# Patient Record
Sex: Male | Born: 1960 | Hispanic: Yes | Marital: Married
Health system: Southern US, Community
[De-identification: ages and names within clinical notes are randomized; demographics above are authoritative.]

## PROBLEM LIST (undated history)

## (undated) HISTORY — PX: VASCULAR SURGERY: SHX849

---

## 2011-10-04 ENCOUNTER — Inpatient Hospital Stay: Payer: Self-pay | Admitting: Surgery

## 2011-10-04 LAB — COMPREHENSIVE METABOLIC PANEL
Albumin: 4.4 g/dL (ref 3.4–5.0)
Alkaline Phosphatase: 79 U/L (ref 50–136)
Anion Gap: 5 — ABNORMAL LOW (ref 7–16)
Calcium, Total: 9.6 mg/dL (ref 8.5–10.1)
Chloride: 101 mmol/L (ref 98–107)
EGFR (African American): >60
EGFR (Non-African Amer.): >60
Glucose: 133 mg/dL — ABNORMAL HIGH (ref 65–99)
Potassium: 3.9 mmol/L (ref 3.5–5.1)
SGOT(AST): 23 U/L (ref 15–37)
SGPT (ALT): 26 U/L
Total Protein: 8.3 g/dL — ABNORMAL HIGH (ref 6.4–8.2)

## 2011-10-04 LAB — CBC
HCT: 51.2 % (ref 40.0–52.0)
HGB: 17.2 g/dL (ref 13.0–18.0)
MCH: 31.8 pg (ref 26.0–34.0)
MCHC: 33.6 g/dL (ref 32.0–36.0)
MCV: 95 fL (ref 80–100)
RBC: 5.41 10*6/uL (ref 4.40–5.90)
WBC: 12.2 10*3/uL — ABNORMAL HIGH (ref 3.8–10.6)

## 2011-10-04 LAB — URINALYSIS, COMPLETE
Bacteria: NONE SEEN
Bilirubin,UR: NEGATIVE
Blood: NEGATIVE
Glucose,UR: NEGATIVE mg/dL (ref 0–75)
Leukocyte Esterase: NEGATIVE
RBC,UR: 1 /HPF (ref 0–5)
Squamous Epithelial: 1

## 2011-10-04 LAB — LIPASE, BLOOD: Lipase: 147 U/L (ref 73–393)

## 2011-10-05 LAB — CBC WITH DIFFERENTIAL/PLATELET
Basophil %: 0 %
Eosinophil #: 0 10*3/uL (ref 0.0–0.7)
Eosinophil %: 0 %
HCT: 44.6 % (ref 40.0–52.0)
HGB: 14.8 g/dL (ref 13.0–18.0)
Lymphocyte %: 0 %
MCHC: 33.1 g/dL (ref 32.0–36.0)
MCV: 95 fL (ref 80–100)
Monocyte #: 1.4 x10 3/mm — ABNORMAL HIGH (ref 0.2–1.0)
Monocyte %: 20 %
Neutrophil #: 5.7 10*3/uL (ref 1.4–6.5)
RDW: 13.1 % (ref 11.5–14.5)
WBC: 7.1 10*3/uL (ref 3.8–10.6)

## 2011-10-05 LAB — BASIC METABOLIC PANEL
BUN: 18 mg/dL (ref 7–18)
Chloride: 102 mmol/L (ref 98–107)
Creatinine: 1.1 mg/dL (ref 0.60–1.30)
EGFR (Non-African Amer.): >60
Glucose: 101 mg/dL — ABNORMAL HIGH (ref 65–99)
Osmolality: 278 (ref 275–301)
Potassium: 3.2 mmol/L — ABNORMAL LOW (ref 3.5–5.1)
Sodium: 138 mmol/L (ref 136–145)

## 2011-11-25 ENCOUNTER — Observation Stay: Payer: Self-pay | Admitting: Surgery

## 2011-11-25 LAB — COMPREHENSIVE METABOLIC PANEL
Albumin: 4.3 g/dL (ref 3.4–5.0)
Anion Gap: 5 — ABNORMAL LOW (ref 7–16)
BUN: 16 mg/dL (ref 7–18)
Bilirubin,Total: 0.8 mg/dL (ref 0.2–1.0)
Calcium, Total: 9.9 mg/dL (ref 8.5–10.1)
Chloride: 103 mmol/L (ref 98–107)
Creatinine: 0.94 mg/dL (ref 0.60–1.30)
Potassium: 4.5 mmol/L (ref 3.5–5.1)
SGOT(AST): 24 U/L (ref 15–37)
Sodium: 140 mmol/L (ref 136–145)
Total Protein: 8 g/dL (ref 6.4–8.2)

## 2011-11-25 LAB — CBC
HGB: 17 g/dL (ref 13.0–18.0)
MCHC: 33.6 g/dL (ref 32.0–36.0)
Platelet: 139 10*3/uL — ABNORMAL LOW (ref 150–440)
RDW: 12.9 % (ref 11.5–14.5)
WBC: 16.5 10*3/uL — ABNORMAL HIGH (ref 3.8–10.6)

## 2011-11-25 LAB — URINALYSIS, COMPLETE
Bacteria: NONE SEEN
Blood: NEGATIVE
Glucose,UR: NEGATIVE mg/dL (ref 0–75)
Granular Cast: 1
Leukocyte Esterase: NEGATIVE
Ph: 7 (ref 4.5–8.0)
Protein: 30
Specific Gravity: 1.029 (ref 1.003–1.030)
Squamous Epithelial: <1

## 2011-11-25 LAB — LIPASE, BLOOD: Lipase: 130 U/L (ref 73–393)

## 2011-11-26 LAB — CBC WITH DIFFERENTIAL/PLATELET
Basophil #: 0 10*3/uL (ref 0.0–0.1)
Eosinophil #: 0.1 10*3/uL (ref 0.0–0.7)
HCT: 47.1 % (ref 40.0–52.0)
Lymphocyte #: 1 10*3/uL (ref 1.0–3.6)
Lymphocyte %: 16 %
MCH: 31 pg (ref 26.0–34.0)
MCHC: 32.7 g/dL (ref 32.0–36.0)
Monocyte #: 0.7 x10 3/mm (ref 0.2–1.0)
Monocyte %: 11.4 %
Platelet: 158 10*3/uL (ref 150–440)
RDW: 13.1 % (ref 11.5–14.5)
WBC: 6.3 10*3/uL (ref 3.8–10.6)

## 2011-11-26 LAB — BASIC METABOLIC PANEL
BUN: 18 mg/dL (ref 7–18)
EGFR (Non-African Amer.): >60
Glucose: 100 mg/dL — ABNORMAL HIGH (ref 65–99)
Osmolality: 281 (ref 275–301)
Potassium: 3.8 mmol/L (ref 3.5–5.1)
Sodium: 140 mmol/L (ref 136–145)

## 2012-04-04 ENCOUNTER — Inpatient Hospital Stay: Payer: Self-pay | Admitting: Surgery

## 2012-04-04 LAB — CBC
HCT: 47.6 % (ref 40.0–52.0)
HGB: 15.9 g/dL (ref 13.0–18.0)
MCHC: 33.4 g/dL (ref 32.0–36.0)
MCV: 93 fL (ref 80–100)
RBC: 5.1 10*6/uL (ref 4.40–5.90)
RDW: 12.6 % (ref 11.5–14.5)
WBC: 14.7 10*3/uL — ABNORMAL HIGH (ref 3.8–10.6)

## 2012-04-04 LAB — URINALYSIS, COMPLETE
Bacteria: NONE SEEN
Ketone: NEGATIVE
Leukocyte Esterase: NEGATIVE
Nitrite: NEGATIVE
Ph: 8 (ref 4.5–8.0)
Protein: NEGATIVE
RBC,UR: <1 /HPF (ref 0–5)
Squamous Epithelial: <1
WBC UR: 1 /HPF (ref 0–5)

## 2012-04-04 LAB — COMPREHENSIVE METABOLIC PANEL
Albumin: 4.4 g/dL (ref 3.4–5.0)
Alkaline Phosphatase: 72 U/L (ref 50–136)
Anion Gap: 5 — ABNORMAL LOW (ref 7–16)
BUN: 17 mg/dL (ref 7–18)
Calcium, Total: 9.6 mg/dL (ref 8.5–10.1)
EGFR (African American): >60
Glucose: 114 mg/dL — ABNORMAL HIGH (ref 65–99)
Osmolality: 282 (ref 275–301)
SGOT(AST): 20 U/L (ref 15–37)
SGPT (ALT): 26 U/L (ref 12–78)
Total Protein: 7.7 g/dL (ref 6.4–8.2)

## 2012-04-05 LAB — CBC WITH DIFFERENTIAL/PLATELET
Basophil #: 0 10*3/uL (ref 0.0–0.1)
Eosinophil #: 0 10*3/uL (ref 0.0–0.7)
Eosinophil %: 0.3 %
HCT: 43.6 % (ref 40.0–52.0)
HGB: 14.9 g/dL (ref 13.0–18.0)
Lymphocyte #: 0.8 10*3/uL — ABNORMAL LOW (ref 1.0–3.6)
MCV: 94 fL (ref 80–100)
Monocyte %: 7.6 %
Neutrophil %: 84 %
Platelet: 158 10*3/uL (ref 150–440)
RDW: 12.8 % (ref 11.5–14.5)
WBC: 10.6 10*3/uL (ref 3.8–10.6)

## 2012-04-05 LAB — BASIC METABOLIC PANEL
BUN: 15 mg/dL (ref 7–18)
Calcium, Total: 8.4 mg/dL — ABNORMAL LOW (ref 8.5–10.1)
Co2: 27 mmol/L (ref 21–32)
Creatinine: 0.97 mg/dL (ref 0.60–1.30)
EGFR (Non-African Amer.): >60
Glucose: 111 mg/dL — ABNORMAL HIGH (ref 65–99)
Potassium: 3.5 mmol/L (ref 3.5–5.1)
Sodium: 139 mmol/L (ref 136–145)

## 2012-04-08 LAB — CALCIUM: Calcium, Total: 8 mg/dL — ABNORMAL LOW (ref 8.5–10.1)

## 2012-04-08 LAB — MAGNESIUM: Magnesium: 2.1 mg/dL

## 2012-04-09 LAB — CBC WITH DIFFERENTIAL/PLATELET
Eosinophil #: 0.1 10*3/uL (ref 0.0–0.7)
Eosinophil %: 0.9 %
HCT: 39.9 % — ABNORMAL LOW (ref 40.0–52.0)
Lymphocyte #: 0.9 10*3/uL — ABNORMAL LOW (ref 1.0–3.6)
Lymphocyte %: 9.9 %
MCH: 32.7 pg (ref 26.0–34.0)
MCHC: 35.2 g/dL (ref 32.0–36.0)
MCV: 93 fL (ref 80–100)
Monocyte #: 0.8 x10 3/mm (ref 0.2–1.0)
Monocyte %: 9.6 %
Neutrophil %: 79.3 %
Platelet: 169 10*3/uL (ref 150–440)

## 2012-04-09 LAB — BASIC METABOLIC PANEL
Anion Gap: 8 (ref 7–16)
Co2: 28 mmol/L (ref 21–32)
Creatinine: 0.89 mg/dL (ref 0.60–1.30)
EGFR (Non-African Amer.): >60
Glucose: 127 mg/dL — ABNORMAL HIGH (ref 65–99)

## 2012-04-09 LAB — POTASSIUM: Potassium: 3.4 mmol/L — ABNORMAL LOW (ref 3.5–5.1)

## 2012-04-09 LAB — CALCIUM: Calcium, Total: 8.5 mg/dL (ref 8.5–10.1)

## 2012-04-09 LAB — PHOSPHORUS: Phosphorus: 2.4 mg/dL — ABNORMAL LOW (ref 2.5–4.9)

## 2012-04-10 LAB — POTASSIUM: Potassium: 3.8 mmol/L (ref 3.5–5.1)

## 2012-04-10 LAB — CALCIUM: Calcium, Total: 8.6 mg/dL (ref 8.5–10.1)

## 2012-04-10 LAB — PHOSPHORUS: Phosphorus: 3.3 mg/dL (ref 2.5–4.9)

## 2012-04-11 LAB — POTASSIUM: Potassium: 3.8 mmol/L (ref 3.5–5.1)

## 2012-04-12 LAB — CALCIUM: Calcium, Total: 8.7 mg/dL (ref 8.5–10.1)

## 2012-04-12 LAB — POTASSIUM: Potassium: 3.6 mmol/L (ref 3.5–5.1)

## 2012-04-13 LAB — POTASSIUM: Potassium: 4 mmol/L (ref 3.5–5.1)

## 2012-04-13 LAB — PLATELET COUNT: Platelet: 257 10*3/uL (ref 150–440)

## 2012-04-13 LAB — SODIUM: Sodium: 135 mmol/L — ABNORMAL LOW (ref 136–145)

## 2012-04-13 LAB — MAGNESIUM: Magnesium: 2.1 mg/dL

## 2012-04-13 LAB — CALCIUM: Calcium, Total: 9.3 mg/dL (ref 8.5–10.1)

## 2012-04-13 LAB — PHOSPHORUS: Phosphorus: 3.8 mg/dL (ref 2.5–4.9)

## 2012-04-14 LAB — BASIC METABOLIC PANEL WITH GFR
Anion Gap: 7
BUN: 17 mg/dL
Calcium, Total: 9.1 mg/dL
Chloride: 99 mmol/L
Co2: 31 mmol/L
Creatinine: 0.87 mg/dL
EGFR (African American): >60
EGFR (Non-African Amer.): >60
Glucose: 115 mg/dL — ABNORMAL HIGH
Osmolality: 276
Potassium: 3.9 mmol/L
Sodium: 137 mmol/L

## 2012-04-15 LAB — POTASSIUM: Potassium: 4.1 mmol/L (ref 3.5–5.1)

## 2012-04-15 LAB — MAGNESIUM: Magnesium: 2.2 mg/dL

## 2012-04-15 LAB — SODIUM: Sodium: 137 mmol/L (ref 136–145)

## 2012-04-15 LAB — PHOSPHORUS: Phosphorus: 4.2 mg/dL (ref 2.5–4.9)

## 2012-04-15 LAB — CALCIUM: Calcium, Total: 9.3 mg/dL (ref 8.5–10.1)

## 2012-04-16 LAB — CALCIUM: Calcium, Total: 9.2 mg/dL (ref 8.5–10.1)

## 2012-04-16 LAB — POTASSIUM: Potassium: 3.8 mmol/L (ref 3.5–5.1)

## 2012-04-16 LAB — MAGNESIUM: Magnesium: 2.1 mg/dL

## 2014-03-25 IMAGING — CT CT ABD-PELV W/ CM
1 of 2 series · 14 of 32 positions shown, 18 images · non-contrast
Comparison: none

REASON FOR EXAM: (1) hx of sbo, similar complaints, n/v; (2) hx of sbo,
similar complaints, n/v
COMMENTS:

[Series 2: 3mm soft tissue · axial · 0.71mm/px · z∈[-998,-584]mm · 14 of 152 slices shown, 18 images]
[im 7/152  soft-tissue]
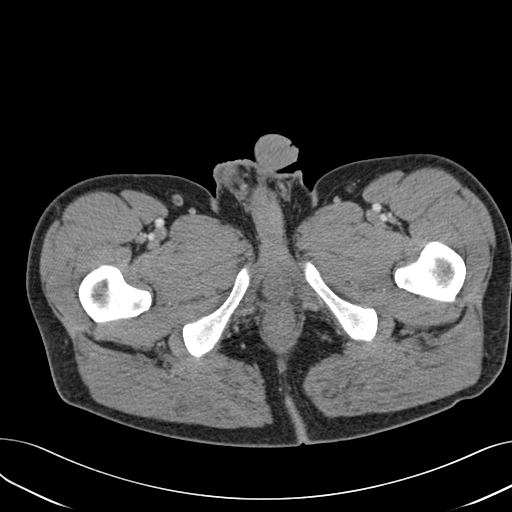
[im 7/152  bone]
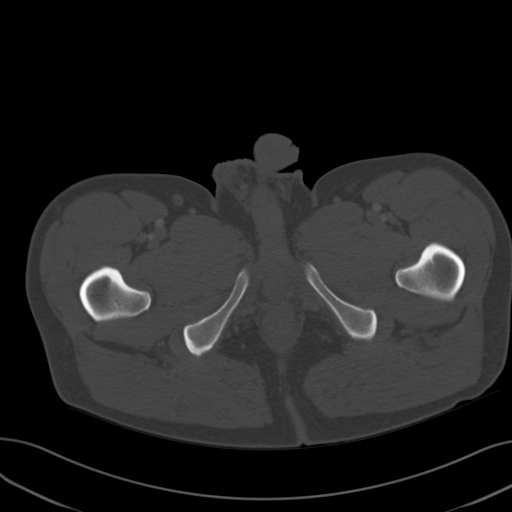
[im 19/152  soft-tissue]
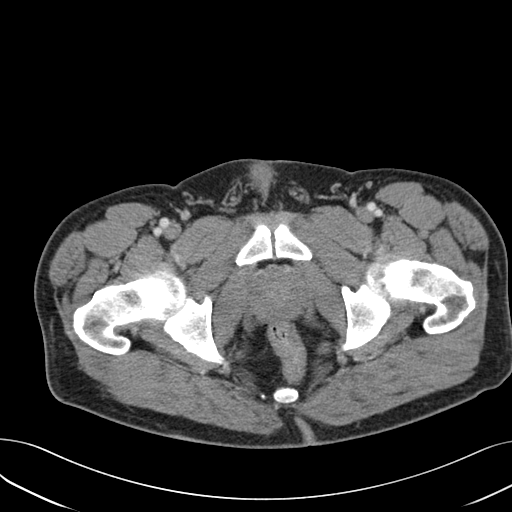
[im 32/152  soft-tissue]
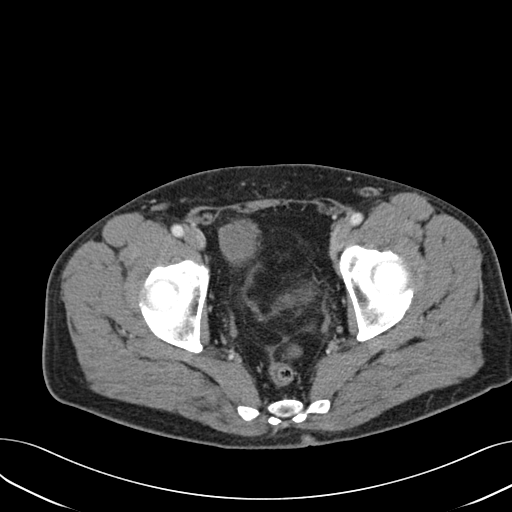
[im 45/152  soft-tissue]
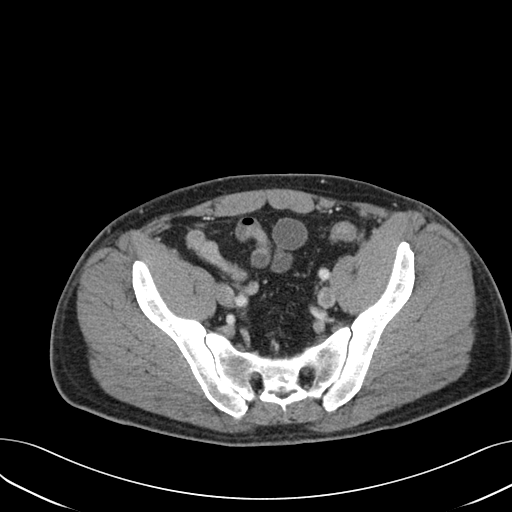
[im 57/152  soft-tissue]
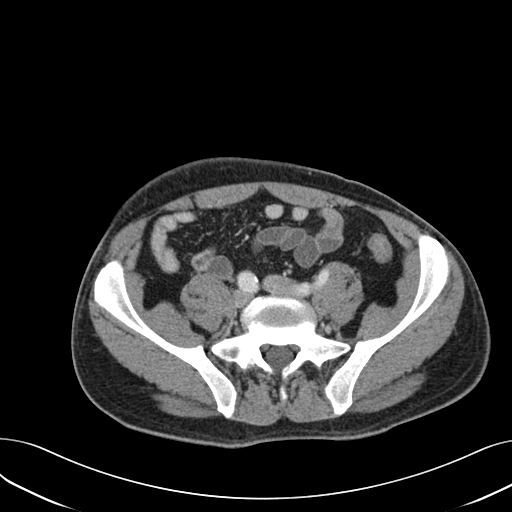
[im 70/152  soft-tissue]
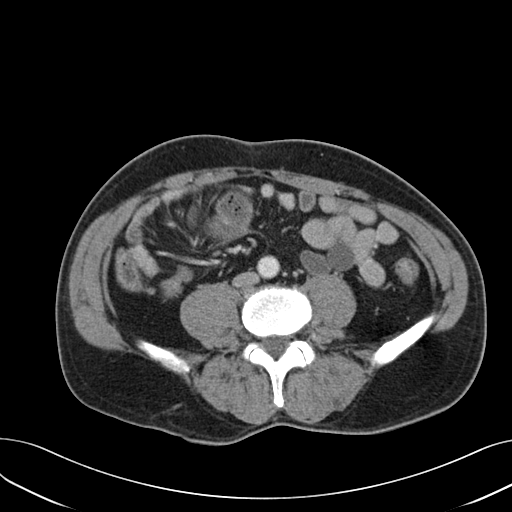
[im 82/152  soft-tissue]
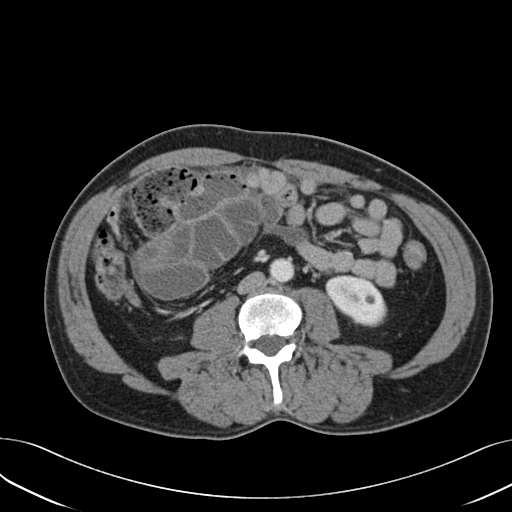
[im 95/152  soft-tissue]
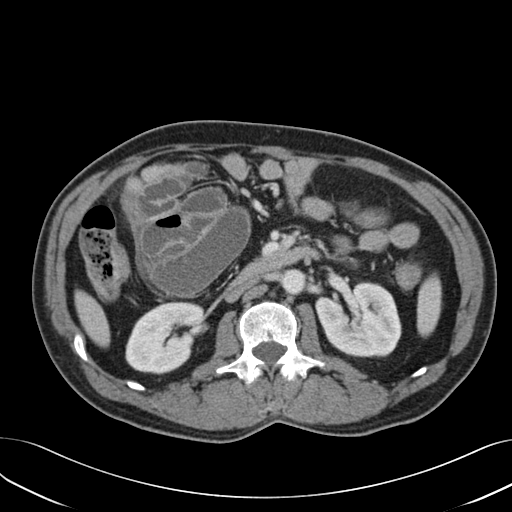
[im 107/152  soft-tissue]
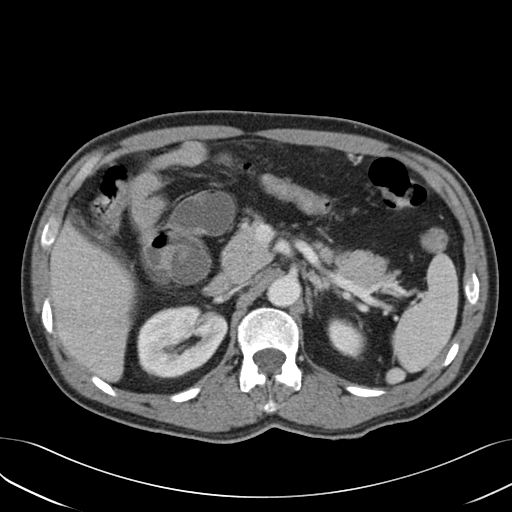
[im 107/152  bone]
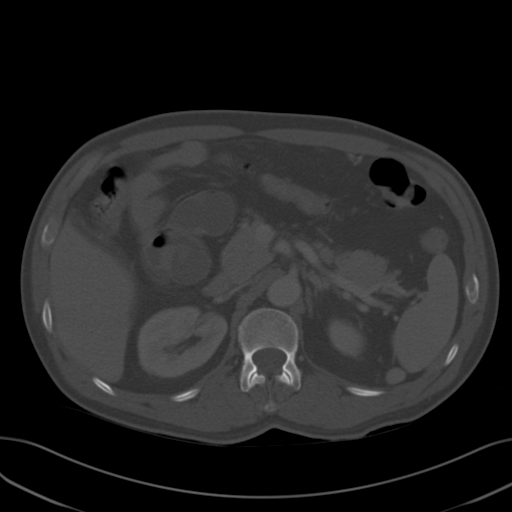
[im 120/152  soft-tissue]
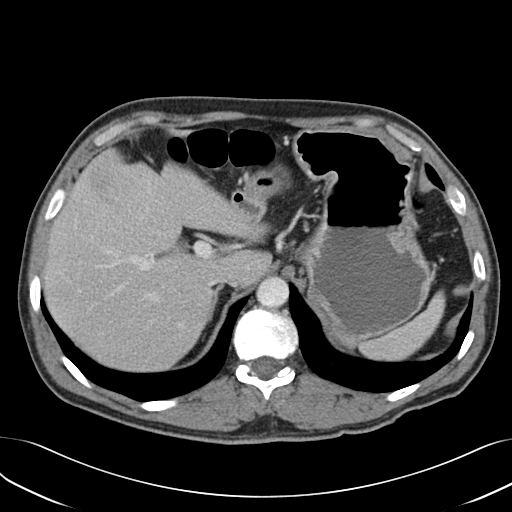
[im 126/152  lung]
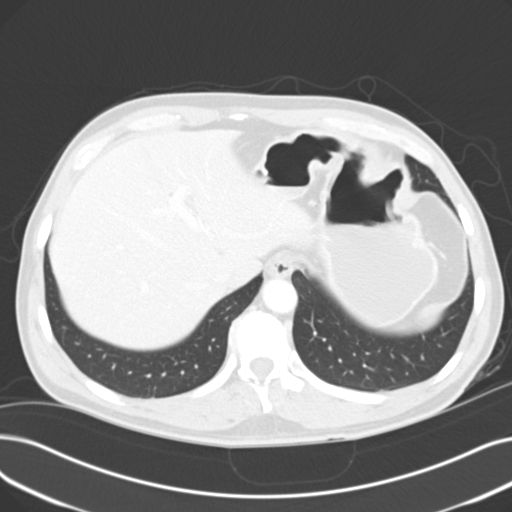
[im 133/152  soft-tissue]
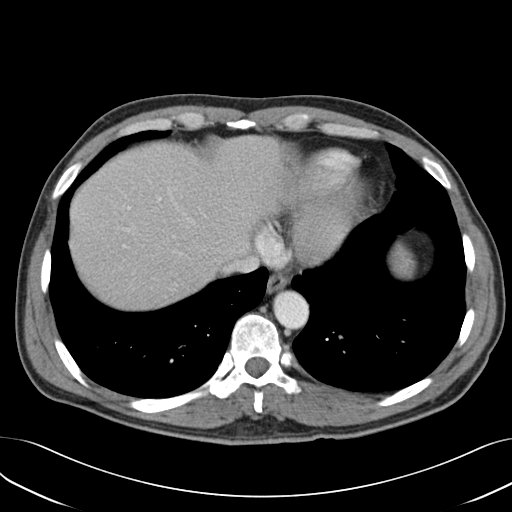
[im 133/152  lung]
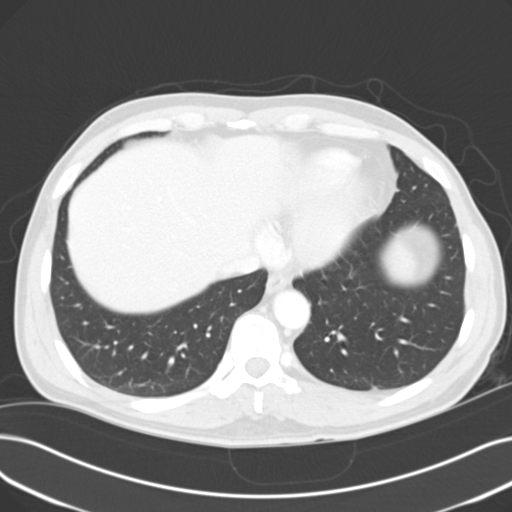
[im 139/152  lung]
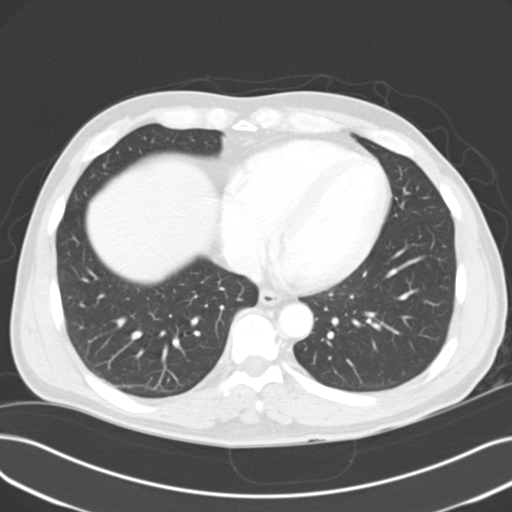
[im 145/152  soft-tissue]
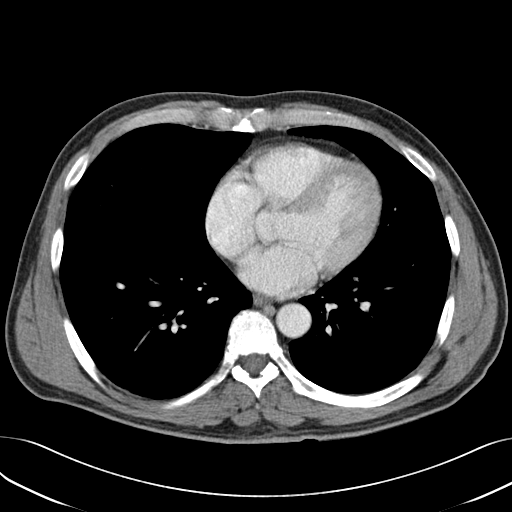
[im 145/152  lung]
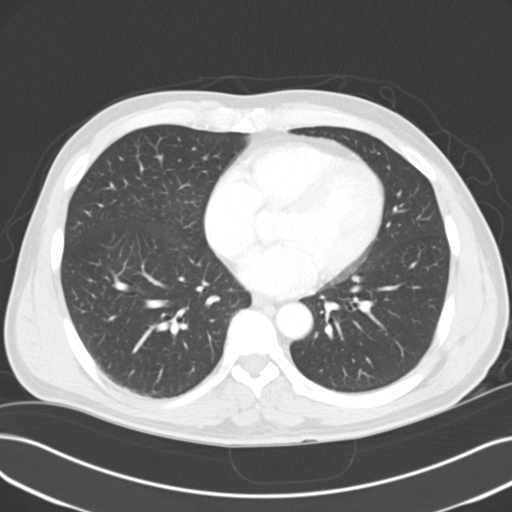

[14 of 32 positions shown; findings below may reference images not displayed]

PROCEDURE:     CT  - CT ABDOMEN / PELVIS  W  - November 25, 2011  [DATE]

RESULT:     Axial CT scanning was performed through the abdomen and pelvis
with reconstructions at 3 mm intervals and slice thicknesses following
intravenous administration of 100 cc of 1sovue-PUG. The patient did not
receive oral contrast material. Review of multiplanar reconstructed images
was performed separately on the VIA monitor. A CT scan 04 October, 2011 is
reviewed as well.

The stomach is moderately distended with gas and fluid. The duodenum and
jejunum do not appear abnormally distended. A transition zone is suspected
in the mid abdomen just below the level of the kidneys where distended loops
of small bowel are demonstrated. The extreme distal aspect of the terminal
ileum does not appear markedly distended but more proximally the distal
ileum is mildly distended with fluid.

The appendix is normal in caliber. The colon contains a small amount of
stool and gas. The liver, gallbladder, pancreas, adrenal glands, spleen, and
kidneys exhibit no acute abnormality. There is a 1.5 cm diameter accessory
spleen. The caliber of the abdominal aorta is normal. The right common iliac
artery is mildly dilated to 1.3 cm. The partially distended urinary bladder
is normal in appearance. The prostate gland is enlarged and produces a
moderate impression upper lung the urinary bladder base.

The lung bases are clear. The lumbar vertebral bodies are preserved in
height.
IMPRESSION: 1. There are loops of mildly distended fluid filled small bowel in the
midline and to the right. These findings are similar to those seen on the
earlier study though the extent of bowel distention is less today. There is
no evidence of perforation nor abscess formation or free fluid. The
distended bowel wall does not appear markedly thickened. No umbilical or
inguinal hernia is seen. An internal hernia or or volvulus of a portion of
the mesentery cannot be excluded.
2. There is no evidence of acute appendicitis nor other acute abnormality of
the colon.
3. There is no acute urinary tract abnormality nor acute large bowel
abnormality.

[REDACTED]

## 2014-03-26 IMAGING — CR DG ABDOMEN 2V
1 series · 4 of 4 positions shown · non-contrast
Comparison: none

REASON FOR EXAM: S
COMMENTS:

[Series 4: w abdomen upright · 0.14mm/px · 4 of 4 slices shown]
[im 1/4]
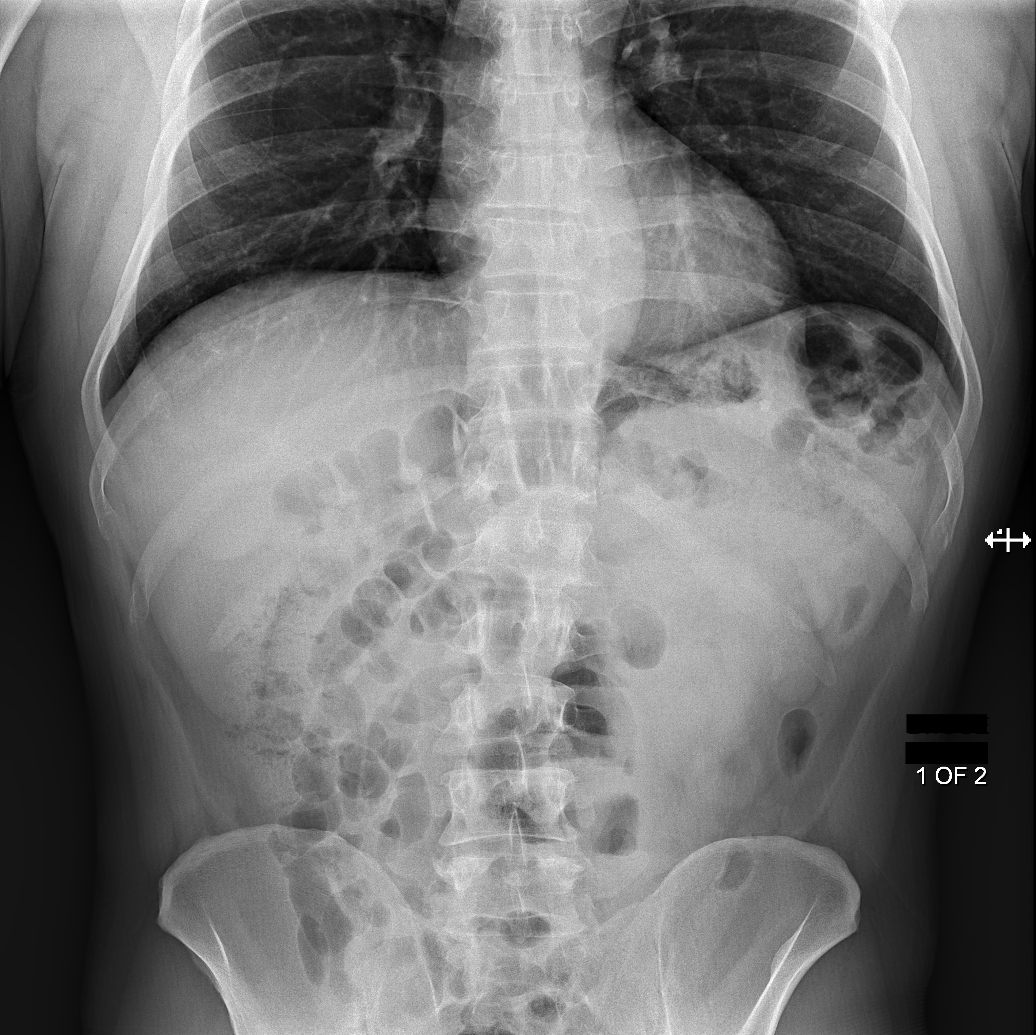
[im 2/4]
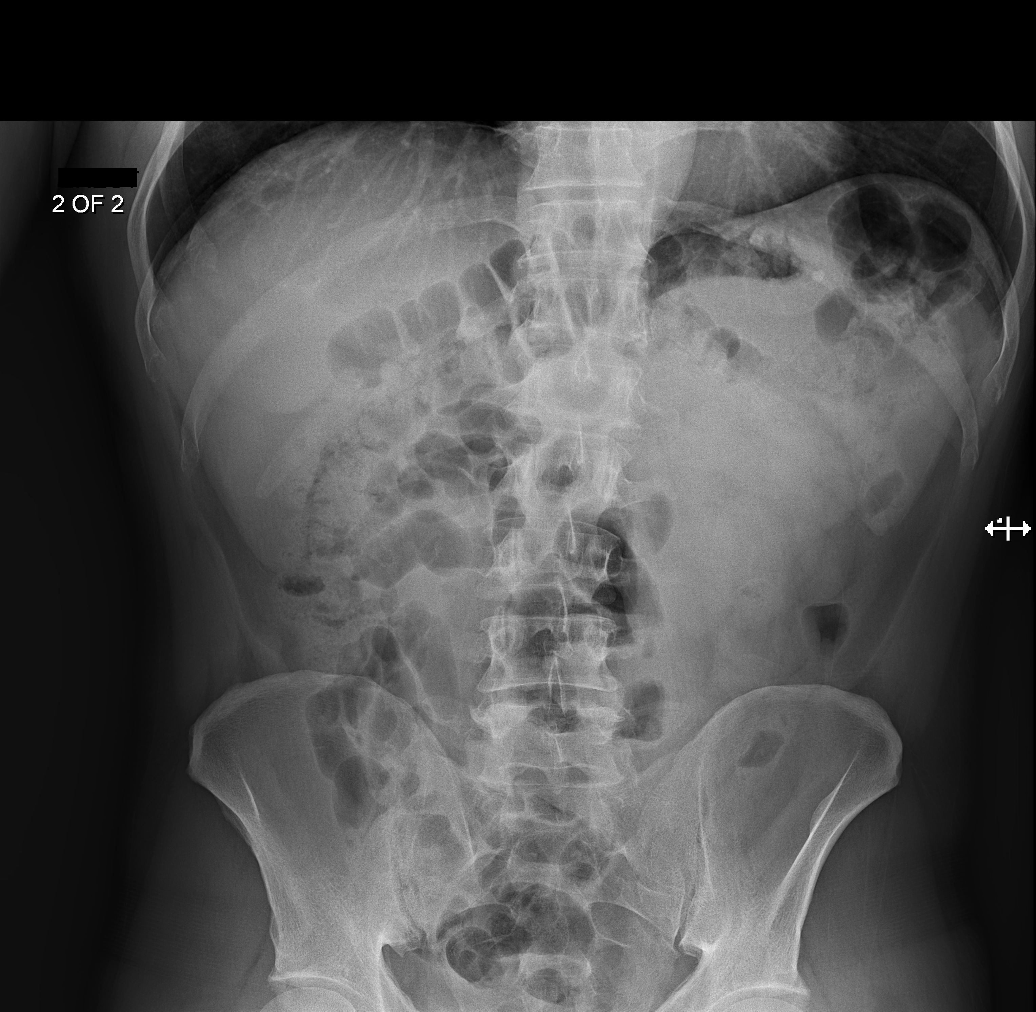
[im 3/4]
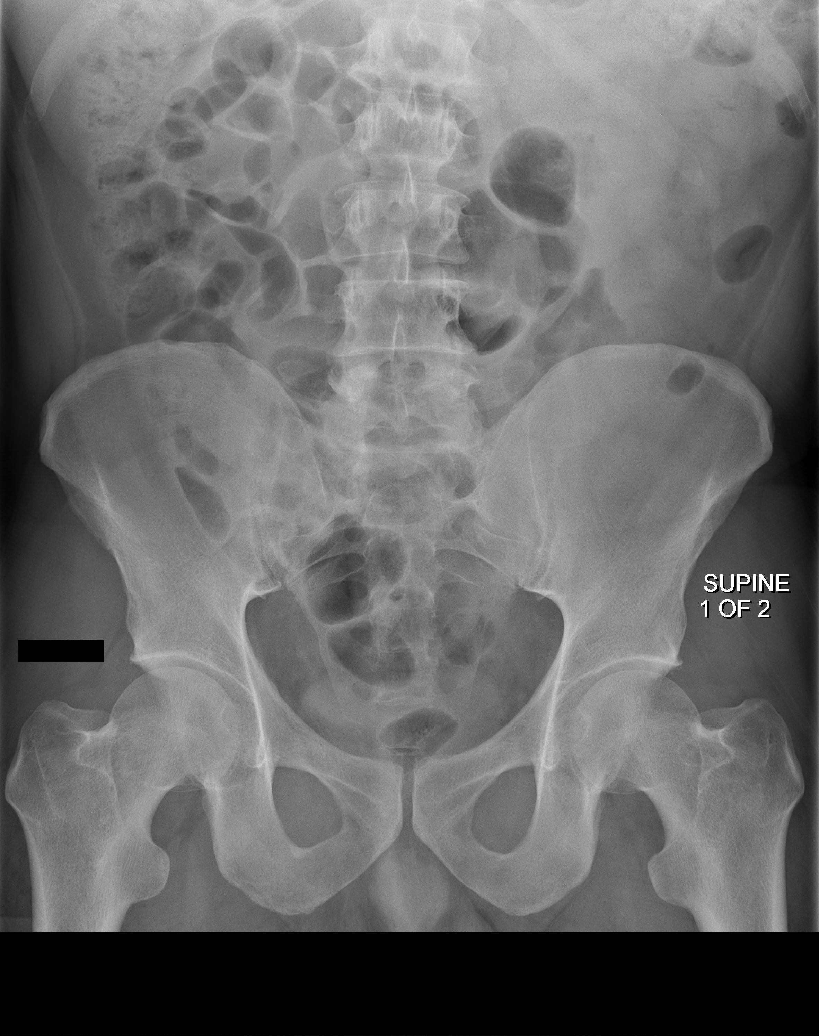
[im 4/4]
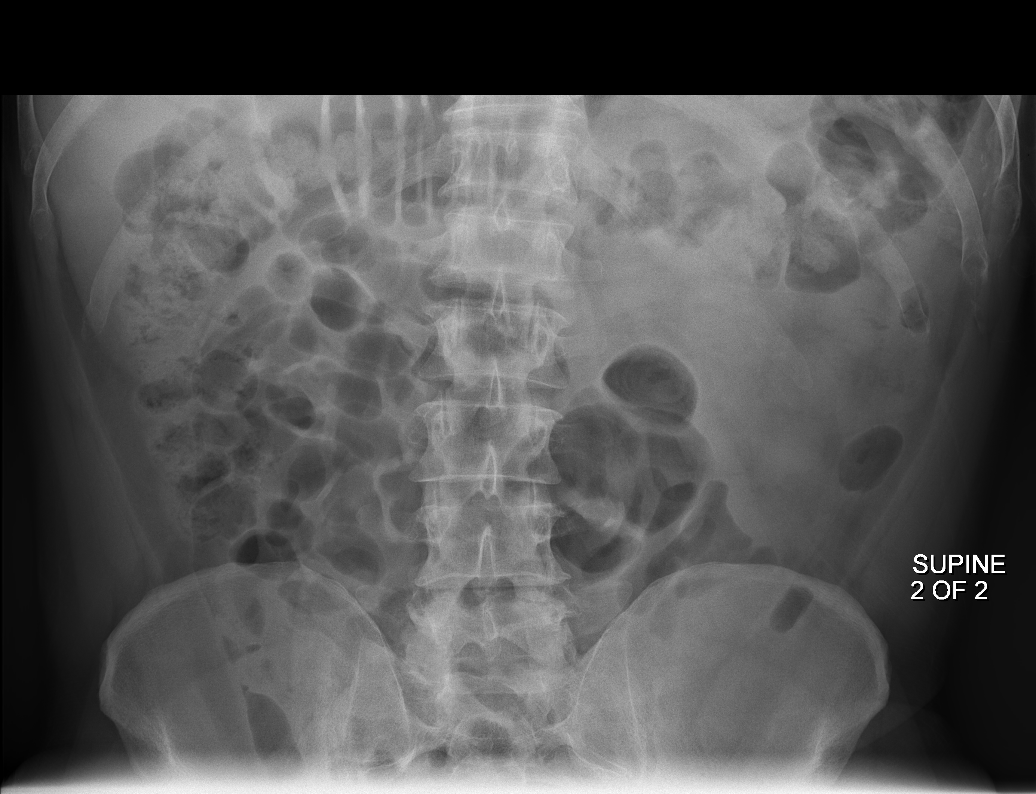

[4 of 4 positions shown; findings below may reference images not displayed]

PROCEDURE:     DXR - DXR ABDOMEN 2 V FLAT AND ERECT  - November 26, 2011  [DATE]

RESULT:     Supine and upright abdominal films are submitted and compared to
a study 25 November, 2011.

The volume of gas within the small and large bowel has increased. There is
gas in the region of the rectosigmoid. No free extraluminal gas collections
are demonstrated. There is a moderate amount of gas in the ascending and
transverse portions of the colon. The lung bases are clear. The bony
structures exhibit no acute abnormalities.
IMPRESSION: The bowel gas pattern may reflect a partial mild small
bowel obstruction but there is no high-grade obstruction nor evidence of
perforation. It may be useful to consider the patient for abdominal CT
scanning with oral contrast if the patient is having persistent symptoms.

[REDACTED]

## 2014-07-23 NOTE — H&P (Signed)
PATIENT NAME:  Alexander Morales, SWINDLE MR#:  [id number] DATE OF BIRTH:  04-28-1960  DATE OF ADMISSION:  11/25/2011  PRIMARY CARE PHYSICIAN: None.   ADMITTING PHYSICIAN: Quentin Ore, III, MD    CHIEF COMPLAINT: Abdominal pain, nausea, and vomiting.   BRIEF HISTORY: Alexander Morales is a 54 year old gentleman seen in the Emergency Room with a 24-hour history of abdominal pain, nausea, and vomiting. He has had increasing crampy abdominal discomfort primarily in the supraumbilical, right upper quadrant area. This progressed over the last 24 hours. It is accompanied by nausea and vomiting. He vomited several times last evening and last vomited at approximately 10:00 this morning. He had a similar episode a month ago where he was admitted to the hospital under the care of Dr. Natale Lay. Contrasted CT scan at that time demonstrated what appeared to be a partial small bowel obstruction likely related to adhesion disease. Dr. Egbert Garibaldi treated him with nasogastric decompression, and symptoms improved within 24 hours. He was discharged home the second hospital day. He relates that he has had multiple episodes in the past, always approximately 10 to 12 months apart. More recently he has been having increasing symptoms. He has been avoiding solid food in an effort to avoid having symptoms.   He denies any history of hepatitis, yellow jaundice, pancreatitis, peptic ulcer disease, gallbladder disease, or diverticulitis. He has had a previous right inguinal hernia repair and a previous epigastric hernia repair. He has no cardiac disease, hypertension, or diabetes. He has no other major medical problems.   MEDICATIONS: He takes no medications regularly.   ALLERGIES: Has no medical allergies.   SOCIAL HISTORY: He smokes occasionally and drinks alcohol occasionally.   FAMILY HISTORY: Noncontributory.   REVIEW OF SYSTEMS: A 10-point review of systems was undertaken and otherwise unremarkable.   PHYSICAL EXAMINATION:  GENERAL: His  blood pressure is 158/94. His heart rate is 84 and regular. His oxygen saturation is satisfactory, and he is afebrile.   HEENT: Exam revealed no scleral icterus. His pupils were equally round. He has no facial deformity.   NECK: Supple without adenopathy. His trachea is midline.   CHEST: Clear with no adventitious sounds. He has normal pulmonary excursion.   CARDIAC: Exam reveals no murmurs or gallops to my ear, and he seems to be in normal sinus rhythm.   ABDOMEN: His abdomen is soft with some mild midepigastric right upper quadrant supraumbilical tenderness. He has a well-healed midline scar above his umbilicus. He has hypoactive at present bowel sounds with no tinkling or rushes. He has no rebound, guarding hernias or masses.   EXTREMITIES: Lower extremity exam reveals full range of motion, no deformities.   PSYCHIATRIC: Exam reveals normal orientation, normal affect.   IMPRESSION/PLAN: Current CT scan without contrast demonstrated some mildly dilated loops of small bowel with some decompressed distal small bowel. There did not appear to be an obvious transition zone, but there may be some mismatch in bowel size in the right upper quadrant. He has not vomited over the last eight hours. We will admit him to the hospital, place him on pain medication and IV rehydration. If he vomits, we will continue nasogastric suction. I talked with him and his wife about the options for surgical intervention. He may need to consider exploratory laparotomy, lysis of adhesions, if his symptoms are going to  recur on such a frequent basis. Contrasted CT scan may be of some benefit to better delineate the anatomy if his symptoms do not improve or if  there is any further concern about the diagnosis.      This plan has been discussed with the patient in detail, and he and his wife are in agreement.   ____________________________ Carmie Endalph L. Ely III, MD rle:cbb D: 11/25/2011 17:37:27 ET T: 11/25/2011 18:07:46  ET JOB#: [id number]  cc: Quentin Orealph L. Ely III, MD, <Dictator>  Quentin OreALPH L ELY MD ELECTRONICALLY SIGNED 11/28/2011 7:05

## 2014-07-23 NOTE — Discharge Summary (Signed)
PATIENT NAME:  Alexander Morales, Alexander Morales MR#:  [id number] DATE OF BIRTH:  [birth year]  DATE OF ADMISSION:  11/25/2011 DATE OF DISCHARGE:  11/26/2011  BRIEF HISTORY: [name] is a 54 year old gentleman readmitted with what appears to be a partial small bowel obstruction.  He began to develop symptoms again on the evening prior to admission. He was admitted over a month ago with similar symptoms which resolved spontaneously. Work-up again suggested partial small bowel obstruction. The symptoms did not appear to be as significant, but the patient was admitted. He did not vomit in eight hours so we went ahead and observed him rather than decompressed him. We rehydrated him and performed serial examinations. He continued to improve. He had a bowel movement this morning and was able to tolerate a regular diet. Because of his multiple episodes we talked with him about the possibility of considering surgical intervention,  but at the present time he is not interested in that course of action. We will be available to assist as necessary. He is discharged home this evening to be followed in the office as required.     ____________________________ Carmie Endalph L. Ely III, MD rle:bjt D: 11/26/2011 17:04:11 ET T: 11/26/2011 17:46:09 ET JOB#: [id number]  cc: Quentin Orealph L. Ely III, MD, <Dictator> Quentin OreALPH L ELY MD ELECTRONICALLY SIGNED 11/28/2011 7:05

## 2014-07-26 NOTE — Op Note (Signed)
PATIENT NAME:  Alexander Morales, Alexander Morales MR#:  [id number] DATE OF BIRTH:  04/21/1960  DATE OF PROCEDURE:  04/06/2012  PREOPERATIVE DIAGNOSIS: Chronic intermittent partial small bowel obstruction.   POSTOPERATIVE DIAGNOSIS: Chronic intermittent partial small bowel obstruction secondary to chronic intermittent closed loop obstruction involving a complex internal hernia.   PROCEDURES PERFORMED:  Diagnostic laparoscopy Laparotomy and complex lysis of adhesions totaling 90 minutes.  Right subclavian triple lumen catheter placement.  SURGEON: Raynald Kemp, M.D. FACS  ASSISTANT: Hulda Marin, M.D. FACS  TYPE OF ANESTHESIA: General endotracheal.   INDICATIONS: A 54 year old male with three admissions to this hospital over the last several months with complete small bowel obstruction which resolves quickly. CT scan images from two locations are similar in appearance. He and his wife note a long history of intermittent abdominal pain, but less intense, with some occasional nausea and vomiting at home for several years prior to this. He has had a previous right inguinal herniorrhaphy and ventral hernia with mesh. His symptoms resolved quickly this admission but due to his persistent symptomatology, he was brought to the operating room for laparoscopy with lysis of adhesions and possible laparotomy.   FINDINGS: There was no evidence of recurrent abdominal wall hernia. There was a complex-appearing distinct sac of matted small intestines with thickened lining consistent with what appears to be a chronic internal hernia.   SPECIMENS: None.   ESTIMATED BLOOD LOSS: Minimal.   DESCRIPTION OF PROCEDURE: With the patient in the supine position, general endotracheal anesthesia was induced. A Foley catheter was placed under sterile technique. Both arms were padded and tucked at his side. His abdomen was clipped of hair, widely prepped and draped with ChloraPrep solution, and timeout was observed.   A 10 mm Visiport was  placed just to the left of the midline in the upper abdomen under direct visualization with a 0-degree angled scope. Pneumoperitoneum was established.   A 5 mm trocar was placed in the right upper quadrant, a 5 mm bladeless trocar was placed in the left upper quadrant. The patient was then positioned in Trendelenburg position. The ileocecal fat pad was identified and the small bowel was run proximally. In the mid-small bowel was a large mass of what appeared to be matted intestines with a distinct lining over the entire area. There was one loop of bowel entering and one loop of bowel exiting, and that was all that could be seen. At this point, decision was made to convert to a laparotomy.   Ports were then removed under direct visualization.   A midline skin incision was fashioned in the upper abdomen extending to just above the umbilicus with the scalpel and electrocautery, and the existing mesh was divided with Mayo scissors. Mesh edges were then debrided off the fascial edges. The small bowel mass was then eviscerated.  There was a thick hernia sac densely adherent to the mesentary and sersal surfaces od the involved intestines.   Adhesiolysis of this was undertaken for approximately an hour and a half. Approximately three feet of small intestine was discovered within this hernia sac which was not attached to anything else in the abdominal cavity. No enterotomies were created. Several small, less than 5 mm serosal tears were closed in Lembert-type fashion with 3-0 silk suture. One small mesenteric defect was discovered in the dissection, then closed utilizing interrupted figure-of-eight 3-0 silk sutures. At this point, the bowel was entirely viable. The remaining small intestine was eviscerated from the abdomen. There did not appear to be an  evident of malrotation. The ligament of Treitz was to the left of the midline. A nasogastric tube was passed by Anesthesia and confirmed within the antrum of the  stomach by palpation.   The bowel was then returned into the abdomen, but first was individually wrapped with multiple pieces of Seprafilm. A large piece of Seprafilm was placed underneath the fascial incision. The fascial incision was then closed from the extremes of the wounds with running #1 PDS suture with several interrupted #1 Vicryl sutures as internal retention. Subcutaneous tissues were then irrigated. Skin stapler was used to reapproximate the skin edges. Sterile occlusive dressing was placed. The remaining incisions from the laparoscopy were closed with staples. The patient tolerated the procedure well without immediate complication.   A right subclavian central line was placed on first pass following completion of the case in the usual fashion.  ____________________________ Redge GainerMark A. Egbert GaribaldiBird, MD mab:es D: 04/07/2012 13:33:00 ET T: 04/07/2012 14:23:50 ET JOB#: [id number]  cc: Loraine LericheMark A. Egbert GaribaldiBird, MD, <Dictator> Raynald KempMARK A Keaton Beichner MD ELECTRONICALLY SIGNED 04/10/2012 7:47

## 2014-07-26 NOTE — Discharge Summary (Signed)
PATIENT NAME:  Alexander JourneySALAS, Loran MR#:  [id number] DATE OF BIRTH:  07-26-1960  DATE OF ADMISSION:  04/04/2012 DATE OF DISCHARGE:  04/16/2012  DISCHARGE DIAGNOSIS: Internal hernia resulting in intermittent chronic small bowel obstruction.   PRINCIPAL PROCEDURES: Laparotomy with lysis of adhesions, diagnostic laparoscopy on 04/06/2012.   HOSPITAL COURSE: The patient was admitted with recurrent bowel obstruction and abdominal pain. He was brought to the operating room on January 2nd. Complex adhesiolysis was performed with diagnostic laparoscopy. Postoperatively, a nasogastric tube was left in place. He had a prolonged postoperative ileus. His ileus resolved.  His diet was able to be advanced. His pain was well controlled with morphine PCA, followed then by oral pain medications. He was continued on TPN, and a central line was placed at the time of surgery. The patient had resolution of his ileus, had adequate pain control. His wound was healing nicely, and he was discharged home in stable condition on January 12th in stable condition.   DISCHARGE MEDICATIONS: Acetaminophen/oxycodone 5 /325, 1 tab every 6 hours as needed for pain.   DISCHARGE INSTRUCTIONS: Call the office in one week's time. No heavy lifting.  Regular diet.   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   PROCEDURES: Laparotomy with lysis of adhesions  ____________________________   Redge GainerMark A. Egbert GaribaldiBird, MD mab:cb D: 04/30/2012 07:03:36 ET T: 04/30/2012 08:43:25 ET JOB#: [id number] cc: Loraine LericheMark A. Egbert GaribaldiBird, MD, <Dictator> Raynald KempMARK A Janya Eveland MD ELECTRONICALLY SIGNED 04/30/2012 19:24

## 2014-07-26 NOTE — H&P (Signed)
PATIENT NAME:  Alexander Morales, Savva MR#:  [id number] DATE OF BIRTH:  02-Apr-1961  DATE OF ADMISSION:  04/04/2012  ADMITTING DIAGNOSIS: Recurrent small bowel obstruction.   HISTORY: This is a 54 year old Hispanic male without past medical history who approximately 9 years ago had a ventral hernia repair. He has had a previous inguinal herniorrhaphy as well. The patient was admitted to the surgical service back in July of this year with a high-grade partial small bowel obstruction which resolved with nonsurgical therapy. The patient was then readmitted in August of this year for 2 days at which point he had resolution of his symptomatology as well. The patient states that yesterday morning he developed significant abdominal pain in the central area of his abdomen followed by self-induced vomiting 2 to 3 times. Last bowel movement and flatus was yesterday. He does admit to very frequent similar but less intense pain episodes at home which have resolved on their own. He cannot tell me how many times that this has happened in between hospitalizations.   ALLERGIES: None.   MEDICATIONS: None.   PAST MEDICAL HISTORY: None.   SOCIAL HISTORY: He smokes occasionally, drinks occasionally.   FAMILY HISTORY: Noncontributory.   REVIEW OF SYSTEMS: As described above and as per 10-point review, otherwise unremarkable.   PHYSICAL EXAMINATION:  GENERAL: The patient is an alert and oriented Hispanic male in no obvious distress.   VITAL SIGNS: Temperature is 98.7, pulse is 72, respiratory rate of 22 and blood pressure is 170/96. Height is 5 feet, 5 inches. BMI is 26.6.   LUNGS: Clear bilaterally.   HEART: Regular rate and rhythm.   NECK: Without adenopathy or thyromegaly.   HEENT: Facies are symmetrical. Extraocular muscles are intact.   ABDOMEN: Mildly distended. There is a supraumbilical midline incision with no obvious recurrent hernia. It is tender to the right and laterally on the umbilicus. There are no  peritoneal signs present.   EXTREMITIES: Warm and perfused.   SKIN: Without rash or jaundice.   MUSCULOSKELETAL: Grossly normal.   NEUROLOGIC: Normal.   PSYCHIATRIC: Appropriate mood, judgment and affect.   LABS/RADIOLOGIC STUDIES: Urinalysis is negative.   White count is 14.7, hemoglobin 15.9 and platelet count 164,000. Liver function tests are normal. Lipase 141. Glucose 114, BUN 17, creatinine 1.06, sodium 140, potassium 3.7 and CO2 34. Lipase 141.   Review of CT scan demonstrates a high-grade bowel obstruction just to the right of the midline. As per previous CT scan, it looks similar. There is a small amount of ascites. There is a clear transition zone present. Solid organs appear unremarkable.   IMPRESSION: A 54 year old Hispanic male with small bowel obstruction which appears to be recurrent and chronic.   PLAN: The patient will be admitted, hydrated, given antiemetics, correction of electrolyte abnormalities if they develop and intravenous Unasyn. I discussed with him possible laparoscopy and/or laparotomy based on his clinical course and his desire for definitive surgical care.   TOTAL TIME SPENT: 60 minutes. ____________________________ Redge GainerMark A. Egbert GaribaldiBird, MD mab:sb D: 04/04/2012 13:36:16 ET T: 04/04/2012 13:53:00 ET JOB#: [id number]  cc: Loraine LericheMark A. Egbert GaribaldiBird, MD, <Dictator> Raynald KempMARK A Athelene Hursey MD ELECTRONICALLY SIGNED 04/10/2012 7:41

## 2014-07-28 NOTE — H&P (Signed)
Subjective/Chief Complaint 2 days severe abdominal pain, abdominal bloating.  no flatus or bm since Sat am.  Previous ventral hernia repair about 9 years ago.  No other previous surgery.  Admits to twice a year having similar but less intense episodes of abdominal pain and bloating.  This episode much more prolonged and severe.    History of Present Illness se above    Past History none   Past Med/Surgical Hx:  Denies medical history:   ALLERGIES:  No Known Allergies:     Other Allergies none     Medications none   Family and Social History:   Family History Non-Contributory    Social History negative tobacco, negative ETOH    Place of Living Home   Review of Systems:   Subjective/Chief Complaint see above    Abdominal Pain Yes   Physical Exam:   GEN no acute distress, thin    HEENT pale conjunctivae    NECK supple    RESP normal resp effort    CARD regular rate    ABD positive tenderness  positive Flank Tenderness  no liver/spleen enlargement  distended  very tender over epigastric region.    LYMPH negative neck    EXTR negative cyanosis/clubbing    SKIN normal to palpation    NEURO cranial nerves intact    PSYCH A+O to time, place, person   Lab Results: Hepatic:  01-Jul-13 05:11    Bilirubin, Total  1.4   Alkaline Phosphatase 79   SGPT (ALT) 26 (12-78 NOTE: NEW REFERENCE RANGE 02/26/2011)   SGOT (AST) 23   Total Protein, Serum  8.3   Albumin, Serum 4.4  Routine Chem:  01-Jul-13 05:11    Glucose, Serum  133   BUN  19   Creatinine (comp) 1.24   Sodium, Serum 139   Potassium, Serum 3.9   Chloride, Serum 101   CO2, Serum  33   Calcium (Total), Serum 9.6   Osmolality (calc) 282   eGFR (African American) >60   eGFR (Non-African American) >60 (eGFR values <73mL/min/1.73 m2 may be an indication of chronic kidney disease (CKD). Calculated eGFR is useful in patients with stable renal function. The eGFR calculation will not be reliable in  acutely ill patients when serum creatinine is changing rapidly. It is not useful in  patients on dialysis. The eGFR calculation may not be applicable to patients at the low and high extremes of body sizes, pregnant women, and vegetarians.)   Anion Gap  5   Lipase 147 (Result(s) reported on 04 Oct 2011 at 05:44AM.)  Routine UA:  01-Jul-13 05:11    Color (UA) Amber   Clarity (UA) Hazy   Glucose (UA) Negative   Bilirubin (UA) Negative   Ketones (UA) Trace   Specific Gravity (UA) 1.033   Blood (UA) Negative   pH (UA) 5.0   Protein (UA) 30 mg/dL   Nitrite (UA) Negative   Leukocyte Esterase (UA) Negative (Result(s) reported on 04 Oct 2011 at 05:38AM.)   RBC (UA) 1 /HPF   WBC (UA) 2 /HPF   Bacteria (UA) NONE SEEN   Epithelial Cells (UA) 1 /HPF   Mucous (UA) PRESENT (Result(s) reported on 04 Oct 2011 at 05:38AM.)  Routine Hem:  01-Jul-13 05:11    WBC (CBC)  12.2   RBC (CBC) 5.41   Hemoglobin (CBC) 17.2   Hematocrit (CBC) 51.2   Platelet Count (CBC) 195 (Result(s) reported on 04 Oct 2011 at 05:36AM.)   MCV 95  MCH 31.8   MCHC 33.6   RDW 13.4   Radiology Results: CT:    01-Jul-13 10:14, CT Abdomen and Pelvis With Contrast   CT Abdomen and Pelvis With Contrast   REASON FOR EXAM:    (1) mid abd pain; (2) mid abd pain  COMMENTS:       PROCEDURE: CT  - CT ABDOMEN / PELVIS  W  - Oct 04 2011 10:14AM     RESULT: Axial CT scanning was performed through the abdomen and pelvis   with reconstructions at 3 mm intervalsand slice thicknesses. The patient   received 100 cc of Isovue 300 and also received oral contrast material.   Review of multiplanar reconstructed images was performed separately on   the VIA monitor.    The stomach is distended with the orally administered contrast. Very   little of the contrast has passed into the small bowel. There are   multiple loops of mildly distended small bowel in the mid abdomen. The   terminal ileum is normal in appearance. There there are  loops of normal     calibereddistal small bowel demonstrated extending to a normal appearing   terminal ileum. There may be a transition zone in the mid abdomen just   below the level of the mid transverse colon on images 48 through 68. The   right colon demonstrates a small amount of fluid. A normal calibered   appendix is demonstrated on images 74 through 86. There is a normal   amount of stool and gas within the transverse colon. The descending colon   and rectosigmoid are relatively collapsed. The terminal ileum is normal   in appearance. Mild distention of small bowel extends to the terminal   ileum however.     There is a small amount of free fluid in the pelvis. The urinary bladder   is moderately distended. There is enlargement of the prostate gland. A   tiny filling defect is seen in the posterior aspect of the urinary   bladder on image2 120-121 which is separate from the prostate. It is   noncalcified. There is no inguinal nor umbilical hernia.   The liver, gallbladder, pancreas, spleen, adrenal glands, and kidneys   exhibit no acute abnormality. There is an approximately 2 cm diameter   accessory spleen posteriorly. The caliber of the abdominal aorta is   normal. The lumbar vertebral bodies are preserved in height. The lung   bases are clear.    IMPRESSION:   1. The findings are consistent with a partial distal small bowel   obstruction. There may be a transition zone just inferior to the mid   transverse colon on images 48 through  68. An adhesion cannot be excluded. There is a small amount of free fluid   in the pelvis. An intra-abdominal or pelvic abscess is not demonstrated,   and no inflammatory changes within the mesentery are demonstrated. There   is no evidence of acute appendicitis nor acute colitis.  2. There is a tiny filling defectin the posterior aspect of the urinary     bladder seen on image 120 and 121 which merits further evaluation with   cystoscopy  when the patient has recovered from his acute illness.  3. There is no evidence of acute hepatobiliary abnormality nor acute   abnormality of the kidneys.          Verified By: DAVID A. Martinique, M.D., MD     Assessment/Admission Diagnosis 46 yom  with SBO,  tenderness and ct scan scan appearance concerning for need for urgent surgical intervention.    Plan Admit, npo, ngt re-examine if no improvement needs laparotomy with adhesiolysis.   Electronic Signatures: Sherri Rad (MD)  (Signed 01-Jul-13 13:56)  Authored: CHIEF COMPLAINT and HISTORY, PAST MEDICAL/SURGIAL HISTORY, ALLERGIES, Other Allergies, OTHER MEDICATIONS, FAMILY AND SOCIAL HISTORY, REVIEW OF SYSTEMS, PHYSICAL EXAM, LABS, Radiology, ASSESSMENT AND PLAN   Last Updated: 01-Jul-13 13:56 by Sherri Rad (MD)

## 2015-08-04 ENCOUNTER — Encounter: Payer: Self-pay | Admitting: *Deleted

## 2015-08-04 ENCOUNTER — Emergency Department: Payer: Worker's Compensation

## 2015-08-04 ENCOUNTER — Emergency Department
Admission: EM | Admit: 2015-08-04 | Discharge: 2015-08-04 | Disposition: A | Discharge status: Home / Self Care | Payer: Worker's Compensation | Attending: Emergency Medicine | Admitting: Emergency Medicine

## 2015-08-04 DIAGNOSIS — Y990 Civilian activity done for income or pay: Secondary | ICD-10-CM | POA: Diagnosis not present

## 2015-08-04 DIAGNOSIS — X31XXXA Exposure to excessive natural cold, initial encounter: Secondary | ICD-10-CM | POA: Insufficient documentation

## 2015-08-04 DIAGNOSIS — Y9389 Activity, other specified: Secondary | ICD-10-CM | POA: Insufficient documentation

## 2015-08-04 DIAGNOSIS — Y929 Unspecified place or not applicable: Secondary | ICD-10-CM | POA: Diagnosis not present

## 2015-08-04 DIAGNOSIS — S6992XA Unspecified injury of left wrist, hand and finger(s), initial encounter: Secondary | ICD-10-CM | POA: Diagnosis present

## 2015-08-04 DIAGNOSIS — T69012A Immersion hand, left hand, initial encounter: Secondary | ICD-10-CM | POA: Insufficient documentation

## 2015-08-04 DIAGNOSIS — S61209A Unspecified open wound of unspecified finger without damage to nail, initial encounter: Secondary | ICD-10-CM

## 2015-08-04 MED ORDER — MORPHINE SULFATE (PF) 4 MG/ML IV SOLN
INTRAVENOUS | Status: AC
Start: 2015-08-04 — End: 2015-08-04
  Administered 2015-08-04: 4 mg via INTRAVENOUS
  Filled 2015-08-04: qty 1

## 2015-08-04 MED ORDER — CEPHALEXIN 500 MG PO CAPS: 500.0000 mg | ORAL_CAPSULE | Freq: Four times a day (QID) | ORAL | Status: AC

## 2015-08-04 MED ORDER — MORPHINE SULFATE (PF) 4 MG/ML IV SOLN
4.0000 mg | Freq: Once | INTRAVENOUS | Status: AC
Start: 2015-08-04 — End: 2015-08-04
  Administered 2015-08-04: 4 mg via INTRAVENOUS

## 2015-08-04 MED ORDER — CEFAZOLIN SODIUM 1-5 GM-% IV SOLN
1.0000 g | Freq: Once | INTRAVENOUS | Status: AC
  Administered 2015-08-04: 1 g via INTRAVENOUS
  Filled 2015-08-04: qty 50

## 2015-08-04 MED ORDER — OXYCODONE-ACETAMINOPHEN 5-325 MG PO TABS: 1.0000 | ORAL_TABLET | Freq: Four times a day (QID) | ORAL | Status: AC | PRN

## 2015-08-04 MED ORDER — LIDOCAINE HCL (PF) 1 % IJ SOLN
5.0000 mL | Freq: Once | INTRAMUSCULAR | Status: AC
  Administered 2015-08-04: 5 mL
  Filled 2015-08-04: qty 5

## 2015-08-04 NOTE — ED Notes (Signed)
Finger dressed multiple times but bleeding continued each time and dressing was changed. MD made aware each time. Surgicel applied and pressure held on finger while finger elevated above heart level for 15 minutes and bleeding stopped. New dressing applied and home care instructions given. Delay in discharge due to difficulty with coagulation of laceration.

## 2015-08-04 NOTE — Discharge Instructions (Signed)
Avulsin profunda de la piel (Deep Skin Avulsion) Una avulsin profunda de la piel es un tipo de Omanherida abierta. A menudo, se debe a una lesin grave (traumatismo) que desgarra todas las capas de la piel o una parte entera del cuerpo. Las zonas del cuerpo donde es ms frecuente sufrir una avulsin profunda de la piel incluyen la cara, los labios, las Rauborejas, la nariz y los dedos de las manos. Una avulsin profunda de la piel puede hacer que se vean estructuras que estn debajo de la piel. A travs de la herida, pueden verse los [address], los Oslohuesos, los nervios o los vasos sanguneos. Una avulsin profunda de la piel tambin puede daar importantes estructuras que estn debajo de la piel. Estas incluyen los tendones, los ligamentos, los nervios o los vasos sanguneos. CAUSAS Kindred HealthcareEntre las lesiones que suelen causar una avulsin profunda de la piel, se incluyen las siguientes:  Aplastamiento.  Cada sobre una superficie irregular o con picos.  Mordedura de [place] Beachanimales.  Heridas por arma de fuego.  Quemaduras graves.  Lesiones que se producen cuando el cuerpo se arrastra, por ejemplo, en accidentes de bicicleta o moto. SNTOMAS Entre los sntomas de una avulsin profunda de la piel, se incluyen los siguientes:  Engineer, miningDolor.  Entumecimiento.  Hinchazn.  Una zona deforme en el cuerpo.  Hemorragia, que puede ser muy abundante.  Prdida de lquido de la herida. DIAGNSTICO Esta afeccin se puede diagnosticar mediante la historia clnica y un examen fsico. Tambin pueden tomarle radiografas. TRATAMIENTO El tratamiento elegido para una avulsin profunda de la piel depender del tamao y la profundidad de la herida, y de la ubicacin en el cuerpo. Por lo general, el tratamiento de todos los tipos de avulsiones comienza de la siguiente manera:  Control de Soil scientistla hemorragia.  Lavado de la herida con una solucin de agua y sal libre de microbios (estril).  Eliminacin del tejido muerto de la  herida. Para la cicatrizacin, la herida puede dejarse abierta o cerrada. Esto depender del tamao y la ubicacin de la herida, y de si es probable que se infecte. Habitualmente, las heridas se cubren o se cierran si quedan expuestos los vasos sanguneos, los nervios, el hueso o Research scientist (physical sciences)el cartlago.  Las heridas pequeas y limpias pueden cerrarse con puntos (suturas).  Las heridas que no pueden cerrarse con suturas se cubren con un fragmento de piel (injerto) o un colgajo de piel. La piel puede tomarse de la herida o de una zona cerca de la herida, de otra parte del cuerpo o de un donante.  Las heridas pueden dejarse abiertas si es difcil cerrarlas o cuando haya riesgo de que se infecten. Estas heridas cicatrizan con el tiempo desde abajo [place] arriba. Tambin pueden darle un medicamento. Esto puede incluir lo siguiente:  Antibiticos.  Una vacuna antitetnica.  Una vacuna antirrbica. INSTRUCCIONES PARA EL CUIDADO EN EL HOGAR Medicamentos  Tome o aplquese los medicamentos de venta libre y recetados solamente como se lo haya indicado el mdico.  Si le recetaron un antibitico, tmelo o aplqueselo como se lo haya indicado el mdico. No deje de tomar o usar el antibitico aunque la afeccin mejore.  Puede aplicarse un medicamento contra la picazn mientras la herida cicatrice. selo solamente como se lo haya indicado el mdico. Cuidados de la herida  Existen muchas East Viewmaneras de cerrar y [place]onia Reevescubrir una herida. Por ejemplo, una herida se puede cubrir con suturas, pegamento para la piel o tiras [id number]. Siga las indicaciones del mdico acerca de lo siguiente:  Cmo  cuidar de la herida.  Cmo y cundo cambiar las vendas (vendaje).  Cundo retirar el vendaje.  Cmo quitar lo que se haya utilizado para cerrar la herida.  Mantenga el vendaje seco, como se lo haya indicado el mdico. No tome baos de inmersin, no nade, no use el jacuzzi ni haga ninguna actividad en la que la herida quede debajo  del agua hasta que el mdico lo autorice.  Limpie la herida [employer] o como se lo haya indicado el mdico.  Lave la herida con agua y [place].  Enjuguela con agua para quitar todo el [place].  Seque dando palmaditas con una toalla limpia. No la frote.  No se rasque ni se toque la herida.  Controle la herida [employer] para detectar signos de infeccin. Est atento a lo siguiente:  Dolor, hinchazn o enrojecimiento.  Lquido, sangre o pus. Instrucciones generales  Cuando est sentado o acostado, eleve la zona de la lesin por encima del nivel del corazn.  Concurra a todas las visitas de control como se lo haya indicado el mdico. Esto es importante. SOLICITE ATENCIN MDICA SI:  Le aplicaron la antitetnica y tiene hinchazn, dolor intenso, enrojecimiento o hemorragia en el sitio de la inyeccin.  Tiene fiebre.  El dolor no se alivia con los [employer].  Tiene ms enrojecimiento, hinchazn o dolor en el lugar de la herida.  Observa lquido, sangre o pus que salen de la herida.  Percibe que sale mal olor de la herida o del vendaje.  La herida estaba cerrada y se abre.  Nota un cuerpo extrao en la herida, como un trozo de [place] o vidrio.  Observa que la piel cerca de la herida cambia de color.  Aparece una nueva erupcin cutnea.  Debe cambiar el vendaje con frecuencia debido a que hay secrecin de lquido, sangre o pus de la herida. SOLICITE ATENCIN MDICA DE INMEDIATO SI:  El dolor aumenta repentinamente y es intenso.  Tiene mucha hinchazn alrededor de la herida.  Tiene entumecimiento alrededor de la herida.  Tiene nuseas y vmitos que no desaparecen despus de 24horas.  Se siente mareado o dbil, o se desmaya.  Siente dolor en el pecho.  Tiene dificultad para respirar.  La herida est en la mano o en el pie y no puede mover correctamente uno de los dedos.  La herida est en la mano o en el pie y [employer] dedos tienen un tono  plido o [place].  Tiene una lnea roja que sale de la herida.   Esta informacin no tiene Theme park manager el consejo del mdico. Asegrese de hacerle al mdico cualquier pregunta que tenga.   Document Released: 07/08/2008 Document Revised: 08/06/2014 Elsevier Interactive Patient Education [employer]

## 2015-08-04 NOTE — ED Notes (Signed)
MD at bedside. 

## 2015-08-04 NOTE — ED Notes (Signed)
Surgicel applied due to supply reporting they do not have Combat Gauze per MD order.

## 2015-08-04 NOTE — ED Provider Notes (Signed)
[facility]  ____________________________________________   I have reviewed the triage vital signs and the nursing notes.   HISTORY  Chief Complaint Finger Injury    HPI Margreta JourneyRaul Persons is a 55 y.o. 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 Patient states his tetanus is up to date. Denies any other injury or complaint.       History reviewed. No pertinent past medical history.  There are no active problems to display for this patient.   No past surgical history on file.  No current outpatient prescriptions on file.  Allergies Review of patient's allergies indicates no known allergies.  History reviewed. No pertinent family history.  Social History Social History  Substance Use Topics  . Smoking status: None  . Smokeless tobacco: None  . Alcohol Use: None    Review of Systems  ____________________________________________   PHYSICAL EXAM:  VITAL SIGNS: ED Triage Vitals  Enc Vitals Group     BP 08/04/15 1130 186/107 mmHg     Pulse Rate 08/04/15 1130 74     Resp 08/04/15 1130 18     Temp 08/04/15 1130 97.9 F (36.6 C)     Temp Source 08/04/15 1130 Oral     SpO2 08/04/15 1130 96 %     Weight 08/04/15 1130 160 lb (72.576 kg)     Height 08/04/15 1130 5\' 5"  (1.651 m)     Head Cir --      Peak Flow --      Pain Score 08/04/15 1139 3     Pain Loc --      Pain Edu? --      Excl. in GC? --     Constitutional: Alert and oriented. Well appearing and in no acute distress. Eyes: Conjunctivae are normal. PERRL. EOMI. Head: Atraumatic. Nose: No congestion/rhinnorhea. Mouth/Throat: Mucous membranes are moist.  Oropharynx non-erythematous. Neck: No stridor.   Nontender with no meningismus Cardiovascular: Normal rate, regular rhythm. Grossly normal heart sounds.  Good peripheral  circulation. Respiratory: Normal respiratory effort.  No retractions. Lungs CTAB. Abdominal: Soft and nontender. No distention. No guarding no rebound Back:  There is no focal tenderness or step off there is no midline tenderness there are no lesions noted. there is no CVA tenderness Musculoskeletal: The pad of his index finger on the left has been cut off with some mild maceration. I do not see at this time any evidence of exposed bone, there is a mild oozing of blood at this time with no arterial bleed. No obvious foreign bodies noted. Patient can flex and extend against resistance in that area with no difficulty. Distal cap refill is intact.  No lower extremity tenderness. No joint effusions, no DVT signs strong distal pulses no edema Neurologic:  Normal speech and language. No gross focal neurologic deficits are appreciated.  Skin:  Skin is warm, see above Psychiatric: Mood and affect are normal. Speech and behavior are normal.  ____________________________________________   LABS (all labs ordered are listed, but only abnormal results are displayed)  Labs Reviewed - No data to display ____________________________________________  EKG  I personally interpreted any EKGs ordered by me or triage  ____________________________________________  RADIOLOGY  I reviewed any imaging ordered by me or triage that were performed during my shift and, if possible, patient and/or family made aware of any abnormal findings. ____________________________________________  PROCEDURES  Procedure(s) performed:   Laceration control  #1, digital block, using lidocaine without epi, 6 cc I was able to perform a digital block which seemed to control the patient's pain.  #2 patient wound was copiously irrigated with 2 L of normal saline under pressure, no foreign bodies were noted. He was last extensively with Betadine initially. Surgicel and pressure were used to try to control a slight ooze of blood  that continued. Patient tolerated it well. No large foreign bodies noted.  Critical Care performed: None  ____________________________________________   INITIAL IMPRESSION / ASSESSMENT AND PLAN / ED COURSE  Pertinent labs & imaging results that were available during my care of the patient were reviewed by me and considered in my medical decision making (see chart for details).  Patient neurovascularly intact with a distal finger injury which involves the pad of the finger itself. There is no evidence of acute ligamentous injury or significant vascular injury. He does have some bleeding from the area which is easily controlled with pressure but then seems to restart despite Surgicel. We will try using quick clot or complex gauze to see if he can stop this, patient would prefer not to have cautery done. I do not see anything I can acutely stitch. I did discuss with Dr. Joice Lofts of orthopedic surgery who agrees with plan will follow-up. ____________________________________________   FINAL CLINICAL IMPRESSION(S) / ED DIAGNOSES  Final diagnoses:  None      This chart was dictated using voice recognition software.  Despite best efforts to proofread,  errors can occur which can change meaning.     Jeanmarie Plant, MD 08/04/15 1556

## 2015-08-04 NOTE — ED Notes (Signed)
Pt has reported laceration to left pointer finger. Laceration is wrapped in gauze upon arrival to room from triage RN. Pt deneis pain at this time. Will assess laceration further with MD at bedside.

## 2015-08-04 NOTE — ED Notes (Signed)
Arrives with left index finger pad chopped off, workers comp, finger wrapped up upon arrival

## 2019-03-09 ENCOUNTER — Other Ambulatory Visit: Payer: Self-pay

## 2019-03-09 ENCOUNTER — Other Ambulatory Visit: Payer: Self-pay | Admitting: Family Medicine

## 2019-03-09 DIAGNOSIS — Z20822 Contact with and (suspected) exposure to covid-19: Secondary | ICD-10-CM

## 2019-03-12 LAB — NOVEL CORONAVIRUS, NAA: SARS-CoV-2, NAA: DETECTED — AB

## 2024-03-22 ENCOUNTER — Ambulatory Visit
Admission: EM | Admit: 2024-03-22 | Discharge: 2024-03-22 | Disposition: A | Discharge status: Home / Self Care | Source: Home / Self Care

## 2024-03-22 DIAGNOSIS — J0100 Acute maxillary sinusitis, unspecified: Secondary | ICD-10-CM

## 2024-03-22 DIAGNOSIS — I10 Essential (primary) hypertension: Secondary | ICD-10-CM

## 2024-03-22 DIAGNOSIS — J069 Acute upper respiratory infection, unspecified: Secondary | ICD-10-CM

## 2024-03-22 MED ORDER — AMOXICILLIN-POT CLAVULANATE 875-125 MG PO TABS: 1.0000 | ORAL_TABLET | Freq: Two times a day (BID) | ORAL | 0 refills | Status: AC

## 2024-03-22 NOTE — ED Provider Notes (Signed)
 Alexander Morales    CSN: [id number] Arrival date & time: 03/22/24  1002      History   Chief Complaint Chief Complaint  Patient presents with   Cough    HPI Alexander Morales is a 63 y.o. male.  Accompanied by his daughter, patient presents with congestion and cough x 2 weeks.  No fever, chest pain, shortness of breath.  He has been treating his symptoms with Mucinex and TheraFlu.  Patient's medical history includes dissecting aneurysm of thoracic aorta, primary hypertension, anemia.  The history is provided by the patient, a relative and medical records. No language interpreter was used (Patient declines interpreter.  He prefers that his daughter interpret for him but he speaks and understands most English.).    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 Authorizing Provider  amoxicillin -clavulanate (AUGMENTIN ) 875-125 MG tablet Take 1 tablet by mouth every 12 (twelve) hours. 03/22/24  Yes Corlis Burnard DEL, NP  carvedilol (COREG) 25 MG tablet Take 25 mg by mouth 2 (two) times daily.   Yes [provider]  felodipine (PLENDIL) 10 MG 24 hr tablet Take 10 mg by mouth daily.   Yes [provider]  olmesartan (BENICAR) 40 MG tablet Take 40 mg by mouth daily.   Yes [provider]  sildenafil (VIAGRA) 100 MG tablet Take 100 mg by mouth. 03/05/24  Yes [provider]  oxyCODONE -acetaminophen  (ROXICET) 5-325 MG tablet Take 1 tablet by mouth every 6 (six) hours as needed. Patient not taking: Reported on 03/22/2024 08/04/15   Harlee Lynwood LABOR, MD    Family History History reviewed. No  pertinent family history.  Social History Social History[1]   Allergies   Patient has no known allergies.   Review of Systems Review of Systems  Constitutional:  Negative for chills and fever.  HENT:  Positive for congestion. Negative for ear pain and sore throat.   Respiratory:  Positive for cough. Negative for shortness of breath.   Cardiovascular:  Negative for chest pain and palpitations.     Physical Exam Triage Vital Signs ED Triage Vitals  Encounter Vitals Group     BP 03/22/24 1035 (!) 164/98     Girls Systolic BP Percentile --      Girls Diastolic BP Percentile --      Boys Systolic BP Percentile --      Boys Diastolic BP Percentile --      Pulse Rate 03/22/24 1035 86     Resp 03/22/24 1035 18     Temp 03/22/24 1035 98.2 F (36.8 C)     Temp src --      SpO2 03/22/24 1035 95 %     Weight --      Height --      Head Circumference --      Peak Flow --      Pain Score 03/22/24 1040 0     Pain Loc --      Pain Education --      Exclude from Growth Chart --    No data  found.  Updated Vital Signs BP (!) 144/88   Pulse 86   Temp 98.2 F (36.8 C)   Resp 18   SpO2 95%   Visual Acuity Right Eye Distance:   Left Eye Distance:   Bilateral Distance:    Right Eye Near:   Left Eye Near:    Bilateral Near:     Physical Exam Constitutional:      General: He is not in acute distress. HENT:     Right Ear: Tympanic membrane normal.     Left Ear: Tympanic membrane normal.     Nose: Congestion present.     Mouth/Throat:     Mouth: Mucous membranes are moist.     Pharynx: Oropharynx is clear.  Cardiovascular:     Rate and Rhythm: Normal rate and regular rhythm.     Heart sounds: Normal heart sounds.  Pulmonary:     Effort: Pulmonary effort is normal. No respiratory distress.     Breath sounds: Normal breath sounds.  Neurological:     Mental Status: He is alert.      UC Treatments / Results  Labs (all labs ordered are listed, but only abnormal  results are displayed) Labs Reviewed - No data to display  EKG   Radiology No results found.  Procedures Procedures (including critical care time)  Medications Ordered in UC Medications - No data to display  Initial Impression / Assessment and Plan / UC Course  I have reviewed the triage vital signs and the nursing notes.  Pertinent labs & imaging results that were available during my care of the patient were reviewed by me and considered in my medical decision making (see chart for details).    Acute sinusitis, acute upper respiratory infection, elevated blood pressure reading with hypertension.  Patient has been symptomatic for 2 weeks and is not improving with OTC treatment.  He has no chest pain or shortness of breath.  Treating today with Augmentin .  Instructed him to follow-up with his PCP.  ED precautions given.  Education provided on sinus infection and URI.  Also discussed with patient that his blood pressure is elevated today and needs to be rechecked by his PCP next week.  Education provided on managing hypertension.  He agrees to plan of care.  Final Clinical Impressions(s) / UC Diagnoses   Final diagnoses:  Acute non-recurrent maxillary sinusitis  Acute upper respiratory infection  Elevated blood pressure reading in office with diagnosis of hypertension     Discharge Instructions      Take the Augmentin  as directed.  Follow up with your primary care provider.  Go to the emergency department if you have worsening symptoms.    Your blood pressure is elevated today at 164/98; repeat 144/88.  Please have this rechecked by your primary care provider next week.          ED Prescriptions     Medication Sig Dispense Auth. Provider   amoxicillin -clavulanate (AUGMENTIN ) 875-125 MG tablet Take 1 tablet by mouth every 12 (twelve) hours. 14 tablet Corlis Burnard DEL, NP      PDMP not reviewed this encounter.    [1]  Social History Tobacco Use   Smoking status: Never    Smokeless tobacco: Never  Vaping Use   Vaping status: Never Used  Substance Use Topics   Alcohol use: Yes   Drug use: Never     Corlis Burnard DEL, NP 03/22/24 1104

## 2024-03-22 NOTE — Discharge Instructions (Addendum)
 Take the Augmentin  as directed.  Follow up with your primary care provider.  Go to the emergency department if you have worsening symptoms.    Your blood pressure is elevated today at 164/98; repeat 144/88.  Please have this rechecked by your primary care provider next week.

## 2024-03-22 NOTE — ED Triage Notes (Signed)
 Patient to Urgent Care with complaints of productive cough with chest congestion.  Symptoms x2 weeks.  Using mucinex and theraflu.
